# Patient Record
Sex: Female | Born: 1995 | Race: White | Hispanic: No | Marital: Married | Smoking: Never smoker
Health system: Southern US, Community
[De-identification: ages and names within clinical notes are randomized; demographics above are authoritative.]

## PROBLEM LIST (undated history)

## (undated) HISTORY — PX: WISDOM TOOTH EXTRACTION: SHX21

---
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
## 2016-05-01 ENCOUNTER — Emergency Department
Admission: EM | Admit: 2016-05-01 | Discharge: 2016-05-01 | Disposition: A | Discharge status: Home / Self Care | Payer: PRIVATE HEALTH INSURANCE | Attending: Emergency Medicine | Admitting: Emergency Medicine

## 2016-05-01 DIAGNOSIS — R55 Syncope and collapse: Secondary | ICD-10-CM | POA: Insufficient documentation

## 2016-05-01 DIAGNOSIS — R197 Diarrhea, unspecified: Secondary | ICD-10-CM | POA: Diagnosis not present

## 2016-05-01 DIAGNOSIS — R112 Nausea with vomiting, unspecified: Secondary | ICD-10-CM | POA: Insufficient documentation

## 2016-05-01 LAB — URINALYSIS, COMPLETE (UACMP) WITH MICROSCOPIC
BILIRUBIN URINE: NEGATIVE
Glucose, UA: NEGATIVE mg/dL
Hgb urine dipstick: NEGATIVE
Ketones, ur: 5 mg/dL — AB
LEUKOCYTES UA: NEGATIVE
Nitrite: NEGATIVE
PH: 7 (ref 5.0–8.0)
Protein, ur: NEGATIVE mg/dL
SPECIFIC GRAVITY, URINE: 1.017 (ref 1.005–1.030)

## 2016-05-01 LAB — COMPREHENSIVE METABOLIC PANEL
ALBUMIN: 4.3 g/dL (ref 3.5–5.0)
ALT: 20 U/L (ref 14–54)
AST: 30 U/L (ref 15–41)
Alkaline Phosphatase: 72 U/L (ref 38–126)
Anion gap: 7 (ref 5–15)
BILIRUBIN TOTAL: 1 mg/dL (ref 0.3–1.2)
BUN: 12 mg/dL (ref 6–20)
CHLORIDE: 107 mmol/L (ref 101–111)
CO2: 24 mmol/L (ref 22–32)
CREATININE: 0.67 mg/dL (ref 0.44–1.00)
Calcium: 9.1 mg/dL (ref 8.9–10.3)
GFR calc Af Amer: >60 mL/min (ref >60)
GFR calc non Af Amer: >60 mL/min (ref >60)
GLUCOSE: 90 mg/dL (ref 65–99)
Potassium: 3.9 mmol/L (ref 3.5–5.1)
Sodium: 138 mmol/L (ref 135–145)
TOTAL PROTEIN: 7.1 g/dL (ref 6.5–8.1)

## 2016-05-01 LAB — CBC
HCT: 42.6 % (ref 35.0–47.0)
Hemoglobin: 14.3 g/dL (ref 12.0–16.0)
MCH: 29.2 pg (ref 26.0–34.0)
MCHC: 33.6 g/dL (ref 32.0–36.0)
MCV: 86.8 fL (ref 80.0–100.0)
PLATELETS: 220 10*3/uL (ref 150–440)
RBC: 4.91 MIL/uL (ref 3.80–5.20)
RDW: 12.7 % (ref 11.5–14.5)
WBC: 19 10*3/uL — AB (ref 3.6–11.0)

## 2016-05-01 LAB — POCT PREGNANCY, URINE: PREG TEST UR: NEGATIVE

## 2016-05-01 LAB — LIPASE, BLOOD

## 2016-05-01 MED ORDER — ONDANSETRON 4 MG PO TBDP: ORAL_TABLET | ORAL | 0 refills | Status: DC

## 2016-05-01 MED ORDER — ONDANSETRON 4 MG PO TBDP
4.0000 mg | ORAL_TABLET | Freq: Once | ORAL | Status: AC
  Administered 2016-05-01: 4 mg via ORAL
  Filled 2016-05-01: qty 1

## 2016-05-01 NOTE — ED Provider Notes (Signed)
[facility] Emergency Department Provider Note  ____________________________________________   None    (approximate)  I have reviewed the triage vital signs and the nursing notes.   HISTORY  Chief Complaint Emesis and Diarrhea    HPI Desiree Noble is a 21 y.o. female with no chronic medical issues who presents for evaluation of acute onset nausea, vomiting, diarrhea, and one episode of near-syncope.  She reports that she was in her usual state of health until this morning.  She had some leftover sushi last night but did not fell ill until late this morningwhen she was doing some dishes.  She became very flushed and lightheaded and then had a run to the bathroom to vomit and have diarrhea.  She has had at least 3 loose stools since then and numerous episodes of vomiting although it has stopped.  She has been able to tolerate a bottle of Sprite since coming to the emergency department.  She has very minimal residual nausea but no vomiting.  She denies fever/chills, chest pain, shortness of breath, abdominal pain, dysuria, vaginal bleeding.  Nothing in particular made the symptoms worse and they seemed to improve on their own.   History reviewed. No pertinent past medical history.  There are no active problems to display for this patient.   History reviewed. No pertinent surgical history.  Prior to Admission medications   Medication Sig Start Date End Date Taking? Authorizing Provider  ondansetron (ZOFRAN ODT) 4 MG disintegrating tablet Allow 1-2 tablets to dissolve in your mouth every 8 hours as needed for nausea/vomiting 05/01/16   Loleta Roseory Aella Ronda, MD    Allergies Patient has no known allergies.  History reviewed. No pertinent family history.  Social History Social History  Substance Use Topics  . Smoking status: Never Smoker  . Smokeless tobacco: Never Used  . Alcohol use No    Review of Systems Constitutional: No fever/chills Eyes: No visual  changes. ENT: No sore throat. Cardiovascular: Denies chest pain.  Brief near-syncopal episode Respiratory: Denies shortness of breath. Gastrointestinal: Acute onset nausea, vomiting, and diarrhea, multiple episodes of both.  No abdominal pain.   Genitourinary: Negative for dysuria, hematuria, and vaginal bleeding. Musculoskeletal: Negative for back pain. Skin: Negative for rash. Neurological: Negative for headaches, focal weakness or numbness.  10-point ROS otherwise negative.  ____________________________________________   PHYSICAL EXAM:  VITAL SIGNS: ED Triage Vitals  Enc Vitals Group     BP 05/01/16 1226 (!) 115/44     Pulse Rate 05/01/16 1226 (!) 110     Resp 05/01/16 1226 17     Temp 05/01/16 1226 98.2 F (36.8 C)     Temp Source 05/01/16 1226 Oral     SpO2 05/01/16 1226 97 %     Weight 05/01/16 1227 150 lb (68 kg)     Height 05/01/16 1227 5\' 4"  (1.626 m)     Head Circumference --      Peak Flow --      Pain Score --      Pain Loc --      Pain Edu? --      Excl. in GC? --     Constitutional: Alert and oriented. Well appearing and in no acute distress. Eyes: Conjunctivae are normal. PERRL. EOMI. Head: Atraumatic. Nose: No congestion/rhinnorhea. Mouth/Throat: Mucous membranes are moist. Neck: No stridor.  No meningeal signs.   Cardiovascular: Normal rate, regular rhythm. Good peripheral circulation. Grossly normal heart sounds. Respiratory: Normal respiratory effort.  No retractions. Lungs  CTAB. Gastrointestinal: Soft and nontender. No distention.  Genitourinary: deferred Musculoskeletal: No lower extremity tenderness nor edema. No gross deformities of extremities. Neurologic:  Normal speech and language. No gross focal neurologic deficits are appreciated.  Skin:  Skin is warm, dry and intact. No rash noted. Psychiatric: Mood and affect are normal. Speech and behavior are normal.  ____________________________________________   LABS (all labs ordered are  listed, but only abnormal results are displayed)  Labs Reviewed  LIPASE, BLOOD - Abnormal; Notable for the following:       Result Value   Lipase <10 (*)    All other components within normal limits  CBC - Abnormal; Notable for the following:    WBC 19.0 (*)    All other components within normal limits  URINALYSIS, COMPLETE (UACMP) WITH MICROSCOPIC - Abnormal; Notable for the following:    Color, Urine YELLOW (*)    APPearance CLEAR (*)    Ketones, ur 5 (*)    Bacteria, UA RARE (*)    Squamous Epithelial / LPF 0-5 (*)    All other components within normal limits  COMPREHENSIVE METABOLIC PANEL  POC URINE PREG, ED  POCT PREGNANCY, URINE   ____________________________________________  EKG  None - EKG not ordered by ED physician ____________________________________________  RADIOLOGY   No results found.  ____________________________________________   PROCEDURES  Procedure(s) performed:   Procedures   Critical Care performed: No ____________________________________________   INITIAL IMPRESSION / ASSESSMENT AND PLAN / ED COURSE  Pertinent labs & imaging results that were available during my care of the patient were reviewed by me and considered in my medical decision making (see chart for details).  The patient is well-appearing and in no acute distress.  She has normal vital signs and her labs are notable only for moderate leukocytosis of about 19.  I believe that this leukocytosis represents most likely a viral gastroenteritis.  She is now tolerating oral intake and I will give her Zofran to help further settle her stomach.  She has absolutely no tenderness to palpation of the abdomen and no pelvic or vaginal symptoms which would require a pelvic exam.  The patient is cheerful and upbeat and both the plan for symptomatic treatment with a Zofran prescription and plenty of fluids by mouth.  She did not actually syncopized but even if she had she meets criteria for a low  risk syncope by the [facility] rule.  She was slightly tachycardic upon arrival in the ED but that was at triage and after she drank her bottle of soda her tachycardia has resolved.  She is comfortable with the plan for outpatient follow-up.  I gave my usual and customary return precautions.        ____________________________________________  FINAL CLINICAL IMPRESSION(S) / ED DIAGNOSES  Final diagnoses:  Nausea, vomiting, and diarrhea     MEDICATIONS GIVEN DURING THIS VISIT:  Medications  ondansetron (ZOFRAN-ODT) disintegrating tablet 4 mg (4 mg Oral Given 05/01/16 1448)     NEW OUTPATIENT MEDICATIONS STARTED DURING THIS VISIT:  Discharge Medication List as of 05/01/2016  2:49 PM    START taking these medications   Details  ondansetron (ZOFRAN ODT) 4 MG disintegrating tablet Allow 1-2 tablets to dissolve in your mouth every 8 hours as needed for nausea/vomiting, Print        Discharge Medication List as of 05/01/2016  2:49 PM      Discharge Medication List as of 05/01/2016  2:49 PM       Note:  This document was prepared using Dragon voice recognition software and may include unintentional dictation errors.    Loleta Rose, MD 05/01/16 2302

## 2016-05-01 NOTE — ED Triage Notes (Signed)
Pt c/o N/V/D that started today. Denies pain.

## 2016-05-01 NOTE — Discharge Instructions (Signed)
We believe your symptoms are caused by either a viral infection or possible a bad food exposure.  Either way, since your symptoms have improved, we feel it is safe for you to go home and follow up with your regular doctor.  Please read the included information and stick to a bland diet for the next two days.  Drink plenty of clear fluids, and if you were provided with a prescription, please take it according to the label instructions.    If you develop any new or worsening symptoms, including persistent vomiting not controlled with medication, fever greater than 101, severe or worsening abdominal pain, or other symptoms that concern you, please return immediately to the Emergency Department.  Viral Gastroenteritis, Adult  Viral gastroenteritis is also known as the stomach flu. This condition is caused by various viruses. These viruses can be passed from person to person very easily (are very contagious). This condition may affect your stomach, small intestine, and large intestine. It can cause sudden watery diarrhea, fever, and vomiting. Diarrhea and vomiting can make you feel weak and cause you to become dehydrated. You may not be able to keep fluids down. Dehydration can make you tired and thirsty, cause you to have a dry mouth, and decrease how often you urinate. Older adults and people with other diseases or a weak immune system are at higher risk for dehydration. It is important to replace the fluids that you lose from diarrhea and vomiting. If you become severely dehydrated, you may need to get fluids through an IV tube. What are the causes? Gastroenteritis is caused by various viruses, including rotavirus and norovirus. Norovirus is the most common cause in adults. You can get sick by eating food, drinking water, or touching a surface contaminated with one of these viruses. You can also get sick from sharing utensils or other personal items with an infected person. What increases the risk? This  condition is more likely to develop in people: Who have a weak defense system (immune system). Who live with one or more children who are younger than 28 years old. Who live in a nursing home. Who go on cruise ships. What are the signs or symptoms? Symptoms of this condition start suddenly 1-2 days after exposure to a virus. Symptoms may last a few days or as long as a week. The most common symptoms are watery diarrhea and vomiting. Other symptoms include: Fever. Headache. Fatigue. Pain in the abdomen. Chills. Weakness. Nausea. Muscle aches. Loss of appetite. How is this diagnosed? This condition is diagnosed with a medical history and physical exam. You may also have a stool test to check for viruses or other infections. How is this treated? This condition typically goes away on its own. The focus of treatment is to restore lost fluids (rehydration). Your health care provider may recommend that you take an oral rehydration solution (ORS) to replace important salts and minerals (electrolytes) in your body. Severe cases of this condition may require giving fluids through an IV tube. Treatment may also include medicine to help with your symptoms. Follow these instructions at home: Follow instructions from your health care provider about how to care for yourself at home. Eating and drinking Follow these recommendations as told by your health care provider: Take an ORS. This is a drink that is sold at pharmacies and retail stores. Drink clear fluids in small amounts as you are able. Clear fluids include water, ice chips, diluted fruit juice, and low-calorie sports drinks. Eat bland, easy-to-digest  foods in small amounts as you are able. These foods include bananas, applesauce, rice, lean meats, toast, and crackers. Avoid fluids that contain a lot of sugar or caffeine, such as energy drinks, sports drinks, and soda. Avoid alcohol. Avoid spicy or fatty foods.  General instructions Drink  enough fluid to keep your urine clear or pale yellow. Wash your hands often. If soap and water are not available, use hand sanitizer. Make sure that all people in your household wash their hands well and often. Take over-the-counter and prescription medicines only as told by your health care provider. Rest at home while you recover. Watch your condition for any changes. Take a warm bath to relieve any burning or pain from frequent diarrhea episodes. Keep all follow-up visits as told by your health care provider. This is important. Contact a health care provider if: You cannot keep fluids down. Your symptoms get worse. You have new symptoms. You feel light-headed or dizzy. You have muscle cramps. Get help right away if: You have chest pain. You feel extremely weak or you faint. You see blood in your vomit. Your vomit looks like coffee grounds. You have bloody or black stools or stools that look like tar. You have a severe headache, a stiff neck, or both. You have a rash. You have severe pain, cramping, or bloating in your abdomen. You have trouble breathing or you are breathing very quickly. Your heart is beating very quickly. Your skin feels cold and clammy. You feel confused. You have pain when you urinate. You have signs of dehydration, such as: Dark urine, very little urine, or no urine. Cracked lips. Dry mouth. Sunken eyes. Sleepiness. Weakness. This information is not intended to replace advice given to you by your health care provider. Make sure you discuss any questions you have with your health care provider. Document Released: 02/13/2005 Document Revised: 07/28/2015 Document Reviewed: 10/20/2014 Elsevier Interactive Patient Education  2017 [employer]

## 2019-08-05 ENCOUNTER — Ambulatory Visit (INDEPENDENT_AMBULATORY_CARE_PROVIDER_SITE_OTHER): Payer: No Typology Code available for payment source | Admitting: Podiatry

## 2019-08-05 ENCOUNTER — Encounter: Payer: Self-pay | Admitting: Podiatry

## 2019-08-05 ENCOUNTER — Other Ambulatory Visit: Payer: Self-pay

## 2019-08-05 DIAGNOSIS — B070 Plantar wart: Secondary | ICD-10-CM | POA: Diagnosis not present

## 2019-08-05 NOTE — Progress Notes (Signed)
   Subjective: 24 y.o. female presenting today for evaluation of a skin lesion noted to the right plantar foot has been present for approximately 4-6 years now.  Patient does not experience any pain associated with the area.  She does state that she did go to water parks a lot as a teenager.  She presents for further treatment evaluation   No past medical history on file.  Objective: Physical Exam General: The patient is alert and oriented x3 in no acute distress.   Dermatology: Hyperkeratotic skin lesion(s) noted to the plantar aspect of the right foot approximately 1 cm in diameter. Pinpoint bleeding noted upon debridement. Skin is warm, dry and supple bilateral lower extremities. Negative for open lesions or macerations.   Vascular: Palpable pedal pulses bilaterally. No edema or erythema noted. Capillary refill within normal limits.   Neurological: Epicritic and protective threshold grossly intact bilaterally.    Musculoskeletal Exam:  Negative for any significant pain on palpation to the noted skin lesion(s).  Range of motion within normal limits to all pedal and ankle joints bilateral. Muscle strength 5/5 in all groups bilateral.    Assessment: #1 plantar wart right foot #2 pain in right foot     Plan of Care:  #1 Patient was evaluated. #2 Excisional debridement of the plantar wart lesion(s) was performed using a chisel blade. Salinocaine was applied and the lesion(s) was dressed with a dry sterile dressing. #3  Recommend OTC wart remover daily x4 weeks under occlusion with a Band-Aid #4 return to clinic in 4 weeks  *Scrub tech at [facility], DPM [facility]  Dr. Felecia Shelling, DPM    [address]                                        [place], [place] [address]                Office [phone]  Fax [phone]

## 2019-09-05 ENCOUNTER — Ambulatory Visit: Payer: No Typology Code available for payment source | Admitting: Podiatry

## 2019-09-30 ENCOUNTER — Ambulatory Visit (INDEPENDENT_AMBULATORY_CARE_PROVIDER_SITE_OTHER): Payer: No Typology Code available for payment source | Admitting: Podiatry

## 2019-09-30 ENCOUNTER — Other Ambulatory Visit: Payer: Self-pay

## 2019-09-30 ENCOUNTER — Encounter: Payer: Self-pay | Admitting: Podiatry

## 2019-09-30 DIAGNOSIS — B070 Plantar wart: Secondary | ICD-10-CM | POA: Diagnosis not present

## 2019-09-30 NOTE — Progress Notes (Signed)
   Subjective: 24 y.o. female presenting today for follow-up of a plantar verruca to the right plantar forefoot.  Patient has been applying the Compound W wart remover daily since last visit.  She is unsure if there is improvement she does state that she has had a lot of skin peeling off.  She presents for follow-up treatment and evaluation.   No past medical history on file.  Objective: Physical Exam General: The patient is alert and oriented x3 in no acute distress.   Dermatology: Hyperkeratotic skin lesion(s) noted to the plantar aspect of the right foot approximately 1 cm in diameter. Pinpoint bleeding noted upon debridement. Skin is warm, dry and supple bilateral lower extremities. Negative for open lesions or macerations.   Vascular: Palpable pedal pulses bilaterally. No edema or erythema noted. Capillary refill within normal limits.   Neurological: Epicritic and protective threshold grossly intact bilaterally.    Musculoskeletal Exam:  Negative for any significant pain on palpation to the noted skin lesion(s).  Range of motion within normal limits to all pedal and ankle joints bilateral. Muscle strength 5/5 in all groups bilateral.    Assessment: #1 plantar wart right foot #2 pain in right foot     Plan of Care:  #1 Patient was evaluated.  There appears to be significant improvement and the wart appears to be resolving.  We will continue the same treatment protocol #2 Excisional debridement of the plantar wart lesion(s) was performed using a chisel blade. Salinocaine was applied and the lesion(s) was dressed with a dry sterile dressing. #3  Continue OTC wart remover daily x4 weeks under occlusion with a Band-Aid #4 return to clinic in 4 weeks  *Scrub tech at [facility], DPM [facility]  Dr. Felecia Shelling, DPM    [address]                                        [place], [place] [address]                Office [phone]  Fax  [phone]

## 2019-11-04 ENCOUNTER — Other Ambulatory Visit: Payer: Self-pay

## 2019-11-04 ENCOUNTER — Ambulatory Visit (INDEPENDENT_AMBULATORY_CARE_PROVIDER_SITE_OTHER): Payer: No Typology Code available for payment source | Admitting: Podiatry

## 2019-11-04 DIAGNOSIS — B070 Plantar wart: Secondary | ICD-10-CM

## 2019-11-04 NOTE — Progress Notes (Signed)
   Subjective: 24 y.o. female presenting today for follow-up of a plantar verruca to the right plantar forefoot.  Patient has been applying the Compound W wart remover daily since last visit.  Patient states that she has noticed significant improvement to the area.  She no longer has any pain or tenderness to the area.  No new complaints at this time   No past medical history on file.  Objective: Physical Exam General: The patient is alert and oriented x3 in no acute distress.   Dermatology: Hyperkeratotic skin lesion(s) noted to the plantar aspect of the right foot approximately 1 cm in diameter. Pinpoint bleeding noted upon debridement. Skin is warm, dry and supple bilateral lower extremities. Negative for open lesions or macerations.  There is some maceration around the area secondary to the daily application of the salicylic acid   Vascular: Palpable pedal pulses bilaterally. No edema or erythema noted. Capillary refill within normal limits.   Neurological: Epicritic and protective threshold grossly intact bilaterally.    Musculoskeletal Exam:  Negative for any significant pain on palpation to the noted skin lesion(s).  Range of motion within normal limits to all pedal and ankle joints bilateral. Muscle strength 5/5 in all groups bilateral.    Assessment: #1 plantar wart right foot #2 pain in right foot     Plan of Care:  #1 Patient was evaluated.  There appears to be significant improvement and the wart appears to be mostly resolved.   #2 Excisional debridement of the plantar wart lesion(s) was performed using a chisel blade. Salinocaine was applied and the lesion(s) was dressed with a dry sterile dressing. #3  Continue OTC wart remover daily x4 weeks under occlusion with a Band-Aid.  I suspect that after an additional 4 weeks the patient should be completely resolved of the plantar verruca #4 return to clinic as needed  *Scrub tech at [facility], DPM [facility]  Dr. Felecia Shelling, DPM    [address]                                        [place], [place] [address]                Office [phone]  Fax [phone]

## 2020-07-20 ENCOUNTER — Encounter: Payer: Self-pay | Admitting: Cardiology

## 2020-08-19 ENCOUNTER — Encounter: Payer: Self-pay | Admitting: Cardiology

## 2020-09-12 NOTE — Progress Notes (Signed)
Cardiology Office Note:    Date:  09/14/2020   ID:  Desiree Noble, DOB Jun 26, 1995, MRN [id number]  PCP:  Marylen Ponto, MD  Cardiologist:  Norman Herrlich, MD   Referring MD: Marylen Ponto, MD  ASSESSMENT:    1. Tachycardia   2. Palpitations    PLAN:    In order of problems listed above:  Her symptoms of palpitation awareness of heartbeat are associated with sinus tachycardia rarely atrial and rarely ventricular premature beats.  She has no significant arrhythmia and I think she has more of a cardiac awareness.  In the past she was given propranolol had CNS side effects and fortunately has stopped taking phentermine.  She will avoid over-the-counter proarrhythmic drugs have offered her reassurance and a prescription for a low-dose of a selective short acting beta-blocker she can take if she has severe symptoms on a as needed basis.  I have reassured her she has no evidence of underlying heart disease I will ask her PCP to be sure that she has had thyroid studies checked.  Next appointment I will plan to see her back in my office as needed   Medication Adjustments/Labs and Tests Ordered: Current medicines are reviewed at length with the patient today.  Concerns regarding medicines are outlined above.  Orders Placed This Encounter  Procedures   EKG 12-Lead    Meds ordered this encounter  Medications   metoprolol tartrate (LOPRESSOR) 25 MG tablet    Sig: Take 0.5 tablets (12.5 mg total) by mouth daily as needed (For severe palpitations).    Dispense:  90 tablet    Refill:  3      Chief Complaint  Patient presents with   Tachycardia    History of Present Illness:    Desiree Noble is a 25 y.o. female who is being seen today for the evaluation of palpitation at the request of Marylen Ponto, MD.  She had an event monitor performed PCP office for 7 days initiating 07/20/2020.  Average minimum maximal heart rates were 8648 and 174.  Age-predicted maximal heart rate 195  bpm.  Ventricular ectopy was rare less than 1% supraventricular ectopy was rare with no episodes of atrial fibrillation or flutter.  She had 6 triggered and 1 diary event diary event showed sinus tachycardia rate of 122 bpm with fluttering and shortness of breath the triggered events showed APCs single PVCs and sinus tachycardia rates of 101-125 bpm. For the last 2 years she has had palpitation not severe or sustained.  Seems to be worse when she is in stressful work environment and was worse when she took phentermine for weight loss.  She is no longer on the drug.  No edema shortness of breath chest pain or syncope.  She has no history of heart disease congenital rheumatic or arrhythmia.  History reviewed. No pertinent past medical history.  Past Surgical History:  Procedure Laterality Date   WISDOM TOOTH EXTRACTION      Current Medications: Current Meds  Medication Sig   metoprolol tartrate (LOPRESSOR) 25 MG tablet Take 0.5 tablets (12.5 mg total) by mouth daily as needed (For severe palpitations).     Allergies:   Patient has no known allergies.   Social History   Socioeconomic History   Marital status: Single    Spouse name: Not on file   Number of children: Not on file   Years of education: Not on file   Highest education level: Not on file  Occupational  History   Not on file  Tobacco Use   Smoking status: Never   Smokeless tobacco: Never  Vaping Use   Vaping Use: Never used  Substance and Sexual Activity   Alcohol use: No   Drug use: No   Sexual activity: Not on file  Other Topics Concern   Not on file  Social History Narrative   Not on file   Social Determinants of Health   Financial Resource Strain: Not on file  Food Insecurity: Not on file  Transportation Needs: Not on file  Physical Activity: Not on file  Stress: Not on file  Social Connections: Not on file     Family History: The patient's family history includes Brain cancer in her father; Diabetes in  her mother; Heart Problems in her mother; Stroke in her maternal grandfather.  ROS:   ROS Please see the history of present illness.     All other systems reviewed and are negative.  EKGs/Labs/Other Studies Reviewed:    The following studies were reviewed today:     Recent Labs: 08/09/2017 cholesterol 185 LDL 112 triglycerides 50 HDL 63 05/19/2020 hemoglobin normal 14.3 TSH not reported.  Physical Exam:    VS:  BP 134/74 (BP Location: Right Arm, Patient Position: Sitting, Cuff Size: Normal)   Pulse 91   Ht 5\' 3"  (1.6 m)   Wt 147 lb 12.8 oz (67 kg)   SpO2 99%   BMI 26.18 kg/m     Wt Readings from Last 3 Encounters:  09/14/20 147 lb 12.8 oz (67 kg)  07/20/20 146 lb (66.2 kg)  05/01/16 150 lb (68 kg)     GEN:  Well nourished, well developed in no acute distress HEENT: Normal NECK: No JVD; No carotid bruits LYMPHATICS: No lymphadenopathy CARDIAC: RRR, no murmurs, rubs, gallops RESPIRATORY:  Clear to auscultation without rales, wheezing or rhonchi  ABDOMEN: Soft, non-tender, non-distended MUSCULOSKELETAL:  No edema; No deformity  SKIN: Warm and dry NEUROLOGIC:  Alert and oriented x 3 PSYCHIATRIC:  Normal affect     Signed, 07/01/16, MD  09/14/2020 3:09 PM    [facility]

## 2020-09-14 ENCOUNTER — Other Ambulatory Visit: Payer: Self-pay

## 2020-09-14 ENCOUNTER — Ambulatory Visit (INDEPENDENT_AMBULATORY_CARE_PROVIDER_SITE_OTHER): Payer: No Typology Code available for payment source | Admitting: Cardiology

## 2020-09-14 ENCOUNTER — Encounter: Payer: Self-pay | Admitting: Cardiology

## 2020-09-14 VITALS — BP 134/74 | HR 91 | Ht 63.0 in | Wt 147.8 lb

## 2020-09-14 DIAGNOSIS — R000 Tachycardia, unspecified: Secondary | ICD-10-CM | POA: Insufficient documentation

## 2020-09-14 DIAGNOSIS — R002 Palpitations: Secondary | ICD-10-CM

## 2020-09-14 MED ORDER — METOPROLOL TARTRATE 25 MG PO TABS: 12.5000 mg | ORAL_TABLET | Freq: Every day | ORAL | 3 refills | Status: DC | PRN

## 2020-09-14 NOTE — Patient Instructions (Addendum)
Medication Instructions:  Your physician has recommended you make the following change in your medication:  START: Lopressor 12.5 mg take by mouth daily as needed for severe palpitations.  *If you need a refill on your cardiac medications before your next appointment, please call your pharmacy*   Lab Work: None If you have labs (blood work) drawn today and your tests are completely normal, you will receive your results only by: MyChart Message (if you have MyChart) OR A paper copy in the mail If you have any lab test that is abnormal or we need to change your treatment, we will call you to review the results.   Testing/Procedures: None   Follow-Up: At [facility], you and your health needs are our priority.  As part of our continuing mission to provide you with exceptional heart care, we have created designated Provider Care Teams.  These Care Teams include your primary Cardiologist (physician) and Advanced Practice Providers (APPs -  Physician Assistants and Nurse Practitioners) who all work together to provide you with the care you need, when you need it.  We recommend signing up for the patient portal called "MyChart".  Sign up information is provided on this After Visit Summary.  MyChart is used to connect with patients for Virtual Visits (Telemedicine).  Patients are able to view lab/test results, encounter notes, upcoming appointments, etc.  Non-urgent messages can be sent to your provider as well.   To learn more about what you can do with MyChart, go to [email or web address].    Your next appointment:   As needed  The format for your next appointment:   In Person  Provider:   Norman Herrlich, MD   Other Instructions ]1. Avoid all over-the-counter antihistamines except Claritin/Loratadine and Zyrtec/Cetrizine. 2. Avoid all combination including cold sinus allergies flu decongestant and sleep medications 3. You can use Robitussin DM Mucinex and Mucinex DM for cough. 4.  can use Tylenol aspirin ibuprofen and naproxen but no combinations such as sleep or sinus.

## 2021-08-05 ENCOUNTER — Encounter: Payer: Self-pay | Admitting: Podiatry

## 2021-08-05 ENCOUNTER — Ambulatory Visit (INDEPENDENT_AMBULATORY_CARE_PROVIDER_SITE_OTHER): Payer: No Typology Code available for payment source | Admitting: Podiatry

## 2021-08-05 DIAGNOSIS — B070 Plantar wart: Secondary | ICD-10-CM

## 2021-08-05 NOTE — Progress Notes (Signed)
   Subjective: 26 y.o. female presenting today for follow-up of a plantar verruca to the right plantar forefoot.  Patient was last seen in the office on 11/04/2019.  At that time we had been applying salicylic acid and she had been applying Compound W to the plantar warts.  She says that they have can now come back and they are very sensitive and tender.  She presents for further treatment and evaluation   No past medical history on file.  Objective: Physical Exam General: The patient is alert and oriented x3 in no acute distress.   Dermatology: Hyperkeratotic skin lesion(s) noted to the plantar aspect of the right foot approximately 1 cm in diameter. Pinpoint bleeding noted upon debridement. Skin is warm, dry and supple bilateral lower extremities. Negative for open lesions or macerations.  There is some maceration around the area secondary to the daily application of the salicylic acid   Vascular: Palpable pedal pulses bilaterally. No edema or erythema noted. Capillary refill within normal limits.   Neurological: Epicritic and protective threshold grossly intact bilaterally.    Musculoskeletal Exam:  Negative for any significant pain on palpation to the noted skin lesion(s).  Range of motion within normal limits to all pedal and ankle joints bilateral. Muscle strength 5/5 in all groups bilateral.    Assessment: #1 plantar wart right foot #2 pain in right foot     Plan of Care:  #1 Patient was evaluated.  Unfortunately the patient has not had any good success with salicylic acid or Salinocaine that was applied previously in 2021.  At that time we were on backorder of the Cantharone. #2 Excisional debridement of the plantar wart lesion(s) was performed using a chisel blade.  Cantharone was applied and the lesion(s) was dressed with a dry sterile dressing. 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 Felecia Shelling, DPM     [address]                                        [place], [place] [address]                Office [phone]  Fax [phone]

## 2021-09-13 ENCOUNTER — Ambulatory Visit (INDEPENDENT_AMBULATORY_CARE_PROVIDER_SITE_OTHER): Payer: No Typology Code available for payment source | Admitting: Podiatry

## 2021-09-13 DIAGNOSIS — B070 Plantar wart: Secondary | ICD-10-CM | POA: Diagnosis not present

## 2021-09-13 NOTE — Progress Notes (Signed)
   HPI: 26 y.o. female presenting today for follow-up evaluation of a plantar wart to the right foot.  Patient states that she is doing much better.  Last visit Cantharone was applied.  She says that she believes the wart fell off.  She presents for further treatment and evaluation  No past medical history on file.  Past Surgical History:  Procedure Laterality Date   WISDOM TOOTH EXTRACTION      No Known Allergies   Physical Exam: General: The patient is alert and oriented x3 in no acute distress.  Dermatology: It appears that the wart lesion has resolved.  There is some slight callus tissue to the area  Vascular: Palpable pedal pulses bilaterally. Capillary refill within normal limits.  Negative for any significant edema or erythema  Neurological: Light touch and protective threshold grossly intact  Musculoskeletal Exam: No pedal deformities noted   Assessment: 1.  Plantar wart right forefoot; resolved   Plan of Care:  1. Patient evaluated. 2.  Light debridement of the callus area was performed using a 312 scalpel without incident or bleeding 3.  Recommend OTC Compound W wart remover daily x2 weeks 4.  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 Felecia Shelling, DPM    [address] [place], [place] [address]                Office [phone]  Fax [phone]

## 2022-08-17 ENCOUNTER — Ambulatory Visit: Payer: Self-pay | Admitting: Family

## 2024-05-14 ENCOUNTER — Inpatient Hospital Stay (HOSPITAL_COMMUNITY): Admit: 2024-05-14
